# Patient Record
Sex: Male | Born: 1960 | Race: White | Hispanic: Yes | Marital: Married | Smoking: Never smoker
Health system: Southern US, Community
[De-identification: ages and names within clinical notes are randomized; demographics above are authoritative.]

## PROBLEM LIST (undated history)

## (undated) DIAGNOSIS — E119 Type 2 diabetes mellitus without complications: Secondary | ICD-10-CM

---

## 2007-01-21 ENCOUNTER — Emergency Department: Payer: Self-pay | Admitting: Emergency Medicine

## 2015-02-25 ENCOUNTER — Encounter: Payer: Self-pay | Admitting: *Deleted

## 2015-02-25 ENCOUNTER — Emergency Department
Admission: EM | Admit: 2015-02-25 | Discharge: 2015-02-25 | Disposition: A | Discharge status: Home / Self Care | Payer: No Typology Code available for payment source | Attending: Emergency Medicine | Admitting: Emergency Medicine

## 2015-02-25 ENCOUNTER — Emergency Department: Payer: No Typology Code available for payment source

## 2015-02-25 DIAGNOSIS — S93401A Sprain of unspecified ligament of right ankle, initial encounter: Secondary | ICD-10-CM | POA: Diagnosis not present

## 2015-02-25 DIAGNOSIS — S46911A Strain of unspecified muscle, fascia and tendon at shoulder and upper arm level, right arm, initial encounter: Secondary | ICD-10-CM

## 2015-02-25 DIAGNOSIS — S161XXA Strain of muscle, fascia and tendon at neck level, initial encounter: Secondary | ICD-10-CM | POA: Diagnosis not present

## 2015-02-25 DIAGNOSIS — S39012A Strain of muscle, fascia and tendon of lower back, initial encounter: Secondary | ICD-10-CM | POA: Diagnosis not present

## 2015-02-25 DIAGNOSIS — Y9389 Activity, other specified: Secondary | ICD-10-CM | POA: Insufficient documentation

## 2015-02-25 DIAGNOSIS — E119 Type 2 diabetes mellitus without complications: Secondary | ICD-10-CM | POA: Insufficient documentation

## 2015-02-25 DIAGNOSIS — R52 Pain, unspecified: Secondary | ICD-10-CM

## 2015-02-25 DIAGNOSIS — Y998 Other external cause status: Secondary | ICD-10-CM | POA: Diagnosis not present

## 2015-02-25 DIAGNOSIS — S50811A Abrasion of right forearm, initial encounter: Secondary | ICD-10-CM | POA: Insufficient documentation

## 2015-02-25 DIAGNOSIS — Z79899 Other long term (current) drug therapy: Secondary | ICD-10-CM | POA: Diagnosis not present

## 2015-02-25 DIAGNOSIS — Y92410 Unspecified street and highway as the place of occurrence of the external cause: Secondary | ICD-10-CM | POA: Diagnosis not present

## 2015-02-25 DIAGNOSIS — S199XXA Unspecified injury of neck, initial encounter: Secondary | ICD-10-CM | POA: Diagnosis present

## 2015-02-25 HISTORY — DX: Type 2 diabetes mellitus without complications: E11.9

## 2015-02-25 MED ORDER — CYCLOBENZAPRINE HCL 10 MG PO TABS: 10.0000 mg | ORAL_TABLET | Freq: Three times a day (TID) | ORAL | Status: AC | PRN

## 2015-02-25 MED ORDER — OXYCODONE-ACETAMINOPHEN 5-325 MG PO TABS: 1.0000 | ORAL_TABLET | Freq: Four times a day (QID) | ORAL | Status: AC | PRN

## 2015-02-25 MED ORDER — OXYCODONE HCL 5 MG PO TABS
5.0000 mg | ORAL_TABLET | Freq: Once | ORAL | Status: AC
  Administered 2015-02-25: 5 mg via ORAL

## 2015-02-25 MED ORDER — NAPROXEN 500 MG PO TABS: 500.0000 mg | ORAL_TABLET | Freq: Two times a day (BID) | ORAL | Status: AC

## 2015-02-25 MED ORDER — OXYCODONE HCL 5 MG PO TABS
ORAL_TABLET | ORAL | Status: AC
  Administered 2015-02-25: 5 mg via ORAL
  Filled 2015-02-25: qty 1
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
## 2015-02-25 NOTE — ED Notes (Signed)
Pt at Xray at this time 

## 2015-04-13 ENCOUNTER — Other Ambulatory Visit: Payer: Self-pay | Admitting: Orthopedic Surgery

## 2015-04-13 DIAGNOSIS — S39012D Strain of muscle, fascia and tendon of lower back, subsequent encounter: Secondary | ICD-10-CM

## 2015-04-13 DIAGNOSIS — M5030 Other cervical disc degeneration, unspecified cervical region: Secondary | ICD-10-CM

## 2015-04-13 DIAGNOSIS — M5412 Radiculopathy, cervical region: Secondary | ICD-10-CM

## 2015-04-13 DIAGNOSIS — S161XXD Strain of muscle, fascia and tendon at neck level, subsequent encounter: Secondary | ICD-10-CM

## 2015-04-13 DIAGNOSIS — M5416 Radiculopathy, lumbar region: Secondary | ICD-10-CM

## 2015-04-20 ENCOUNTER — Ambulatory Visit
Admission: RE | Admit: 2015-04-20 | Discharge: 2015-04-20 | Disposition: A | Discharge status: Home / Self Care | Payer: No Typology Code available for payment source | Source: Ambulatory Visit | Attending: Orthopedic Surgery | Admitting: Orthopedic Surgery

## 2015-04-20 DIAGNOSIS — S39012D Strain of muscle, fascia and tendon of lower back, subsequent encounter: Secondary | ICD-10-CM

## 2015-04-20 DIAGNOSIS — M4802 Spinal stenosis, cervical region: Secondary | ICD-10-CM | POA: Insufficient documentation

## 2015-04-20 DIAGNOSIS — M5030 Other cervical disc degeneration, unspecified cervical region: Secondary | ICD-10-CM | POA: Insufficient documentation

## 2015-04-20 DIAGNOSIS — M5416 Radiculopathy, lumbar region: Secondary | ICD-10-CM

## 2015-04-20 DIAGNOSIS — S161XXD Strain of muscle, fascia and tendon at neck level, subsequent encounter: Secondary | ICD-10-CM

## 2015-04-20 DIAGNOSIS — M5412 Radiculopathy, cervical region: Secondary | ICD-10-CM
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
## 2016-11-04 ENCOUNTER — Emergency Department
Admission: EM | Admit: 2016-11-04 | Discharge: 2016-11-04 | Disposition: A | Discharge status: Home / Self Care | Payer: Self-pay | Attending: Emergency Medicine | Admitting: Emergency Medicine

## 2016-11-04 DIAGNOSIS — Z79899 Other long term (current) drug therapy: Secondary | ICD-10-CM | POA: Insufficient documentation

## 2016-11-04 DIAGNOSIS — J111 Influenza due to unidentified influenza virus with other respiratory manifestations: Secondary | ICD-10-CM | POA: Insufficient documentation

## 2016-11-04 DIAGNOSIS — E119 Type 2 diabetes mellitus without complications: Secondary | ICD-10-CM | POA: Insufficient documentation

## 2016-11-04 MED ORDER — DEXAMETHASONE SODIUM PHOSPHATE 10 MG/ML IJ SOLN
INTRAMUSCULAR | Status: AC
  Administered 2016-11-04: 10 mg via INTRAMUSCULAR
  Filled 2016-11-04: qty 1

## 2016-11-04 MED ORDER — OSELTAMIVIR PHOSPHATE 75 MG PO CAPS: 75.0000 mg | ORAL_CAPSULE | Freq: Two times a day (BID) | ORAL | 0 refills | Status: AC

## 2016-11-04 MED ORDER — DEXAMETHASONE SODIUM PHOSPHATE 10 MG/ML IJ SOLN
10.0000 mg | Freq: Once | INTRAMUSCULAR | Status: AC
  Administered 2016-11-04: 10 mg via INTRAMUSCULAR

## 2016-11-04 NOTE — ED Provider Notes (Signed)
[facility] Emergency Department Provider Note  ____________________________________________  Time seen: Approximately 8:54 PM  I have reviewed the triage vital signs and the nursing notes.   HISTORY  Chief Complaint Influenza and Cough    HPI James HarrisJose Ramon Vilma James Floyd James Floyd is a 56 y.o. male presenting to the emergency department with headache, pharyngitis and generalized body aches that started 2 days ago. Triage note noted. Patient states that he has had fever. However, he has not evaluated his temperature. Patient has been staying hydrated. Patient still has a good appetite. No recent travel. Patient has several family members who have experienced similar symptoms. Patient has tried TheraFlu but no other alleviating measures. Patient denies chest pain, shortness of breath, chest tightness, abdominal pain and vomiting.    Past Medical History:  Diagnosis Date  . Diabetes mellitus without complication (HCC)     There are no active problems to display for this patient.   History reviewed. No pertinent surgical history.  Prior to Admission medications   Medication Sig Start Date End Date Taking? Authorizing Provider  cyclobenzaprine (FLEXERIL) 10 MG tablet Take 1 tablet (10 mg total) by mouth 3 (three) times daily as needed for muscle spasms. 02/25/15   Chinita Pesterari B Triplett, FNP  oseltamivir (TAMIFLU) 75 MG capsule Take 1 capsule (75 mg total) by mouth 2 (two) times daily. 11/04/16 11/09/16  Orvil FeilJaclyn M Josemanuel Eakins, PA-C  oxyCODONE-acetaminophen (ROXICET) 5-325 MG per tablet Take 1 tablet by mouth every 6 (six) hours as needed. 02/25/15   Chinita Pesterari B Triplett, FNP    Allergies Patient has no known allergies.  No family history on file.  Social History Social History  Substance Use Topics  . Smoking status: Never Smoker  . Smokeless tobacco: Never Used  . Alcohol use No     Review of Systems  Constitutional: Patient has had fever.  Eyes: No visual changes. No discharge ENT:  Patient has had congestion.  Cardiovascular: no chest pain. Respiratory: Patient has had non-productive cough.  No SOB. Gastrointestinal: Patient has had nausea.  Genitourinary: Negative for dysuria. No hematuria Musculoskeletal: Patient has had myalgias. Skin: Negative for rash, abrasions, lacerations, ecchymosis. Neurological: Patient has headache, no focal weakness or numbness.   ____________________________________________   PHYSICAL EXAM:  VITAL SIGNS: ED Triage Vitals  Enc Vitals Group     BP 11/04/16 1728 (!) 133/95     Pulse Rate 11/04/16 1728 (!) 107     Resp 11/04/16 1728 16     Temp 11/04/16 1728 98.9 F (37.2 C)     Temp Source 11/04/16 1728 Oral     SpO2 11/04/16 1728 98 %     Weight 11/04/16 1729 260 lb (117.9 kg)     Height 11/04/16 1729 5\' 8"  (1.727 m)     Head Circumference --      Peak Flow --      Pain Score 11/04/16 1729 9     Pain Loc --      Pain Edu? --      Excl. in GC? --      Constitutional: Alert and oriented. Patient is lying supine in bed.  Eyes: Conjunctivae are normal. PERRL. EOMI. Head: Atraumatic. ENT:      Ears: Tympanic membranes are injected bilaterally without evidence of effusion or purulent exudate. Bony landmarks are visualized bilaterally. No pain with palpation at the tragus.      Nose: Nasal turbinates are edematous and erythematous. Copious rhinorrhea visualized.      Mouth/Throat: Mucous membranes  are moist. Posterior pharynx is mildly erythematous. Patient has mild tonsillar hypertrophy without purulent exudate. Tonsils assessed at 3. Uvula is midline. Neck: Full range of motion. No pain is elicited with flexion at the neck. Hematological/Lymphatic/Immunilogical: No cervical lymphadenopathy. Cardiovascular: Normal rate, regular rhythm. Normal S1 and S2.  Good peripheral circulation. Respiratory: Normal respiratory effort without tachypnea or retractions. Lungs CTAB. Good air entry to the bases with no decreased or absent  breath sounds. Gastrointestinal: Bowel sounds 4 quadrants. Soft and nontender to palpation. No guarding or rigidity. No palpable masses. No distention. No CVA tenderness.  Skin:  Skin is warm, dry and intact. No rash noted. Psychiatric: Mood and affect are normal. Speech and behavior are normal. Patient exhibits appropriate insight and judgement.  ____________________________________________   LABS (all labs ordered are listed, but only abnormal results are displayed)  Labs Reviewed - No data to display ____________________________________________  EKG   ____________________________________________  RADIOLOGY  No results found.  ____________________________________________    PROCEDURES  Procedure(s) performed:    Procedures    Medications  dexamethasone (DECADRON) injection 10 mg (10 mg Intramuscular Given 11/04/16 1813)     ____________________________________________   INITIAL IMPRESSION / ASSESSMENT AND PLAN / ED COURSE  Pertinent labs & imaging results that were available during my care of the patient were reviewed by me and considered in my medical decision making (see chart for details).  Review of the [place] CSRS was performed in accordance of the [facility] prior to dispensing any controlled drugs.    Assessment and plan: Influenza:  Patient presents to the emergency department with headache, pharyngitis and generalized body aches that started 2 days ago. Patient's symptoms are consistent with influenza. Patient was given Decadron in the emergency department for pharyngitis. He was discharged with Tamiflu. Rest and hydration were encouraged. Patient was advised to follow-up with his primary care provider in one week. Heart rate was reassessed at 95 beats per minute prior to discharge by myself. Physical exam and vital signs are reassuring at this time. All patient questions were answered. ____________________________________________  FINAL CLINICAL IMPRESSION(S)  / ED DIAGNOSES  Final diagnoses:  Influenza      NEW MEDICATIONS STARTED DURING THIS VISIT:  Discharge Medication List as of 11/04/2016  6:11 PM    START taking these medications   Details  oseltamivir (TAMIFLU) 75 MG capsule Take 1 capsule (75 mg total) by mouth 2 (two) times daily., Starting Sun 11/04/2016, Until Fri 11/09/2016, Print            This chart was dictated using voice recognition software/Dragon. Despite best efforts to proofread, errors can occur which can change the meaning. Any change was purely unintentional.    Orvil Feil, PA-C 11/04/16 2102    Jene Every, MD 11/05/16 [phone]

## 2016-11-04 NOTE — ED Notes (Signed)
AAOx3.  Skin warm and dry.  NAD 

## 2016-11-04 NOTE — ED Triage Notes (Signed)
Pt reports that he has generalized body aches with sore throat - pt has been sick for 2 weeks off an on - cough and nasal congestion

## 2017-10-14 ENCOUNTER — Other Ambulatory Visit: Payer: Self-pay | Admitting: Obstetrics and Gynecology

## 2017-10-14 ENCOUNTER — Ambulatory Visit
Admission: RE | Admit: 2017-10-14 | Discharge: 2017-10-14 | Disposition: A | Discharge status: Home / Self Care | Payer: Disability Insurance | Source: Ambulatory Visit | Attending: Obstetrics and Gynecology | Admitting: Obstetrics and Gynecology

## 2017-10-14 DIAGNOSIS — M47819 Spondylosis without myelopathy or radiculopathy, site unspecified: Secondary | ICD-10-CM

## 2017-10-14 DIAGNOSIS — S99919S Unspecified injury of unspecified ankle, sequela: Secondary | ICD-10-CM | POA: Diagnosis not present

## 2017-10-14 DIAGNOSIS — M19079 Primary osteoarthritis, unspecified ankle and foot: Secondary | ICD-10-CM

## 2017-10-14 DIAGNOSIS — S99911A Unspecified injury of right ankle, initial encounter: Secondary | ICD-10-CM

## 2017-10-14 DIAGNOSIS — S3992XA Unspecified injury of lower back, initial encounter: Secondary | ICD-10-CM

## 2017-10-14 DIAGNOSIS — M47896 Other spondylosis, lumbar region: Secondary | ICD-10-CM | POA: Diagnosis not present

## 2017-10-14 DIAGNOSIS — M47899 Other spondylosis, site unspecified: Secondary | ICD-10-CM | POA: Insufficient documentation

## 2017-10-14 DIAGNOSIS — R52 Pain, unspecified: Secondary | ICD-10-CM | POA: Diagnosis not present

## 2017-10-14 DIAGNOSIS — X58XXXS Exposure to other specified factors, sequela: Secondary | ICD-10-CM | POA: Insufficient documentation

## 2017-10-14 DIAGNOSIS — I10 Essential (primary) hypertension: Secondary | ICD-10-CM | POA: Diagnosis not present

## 2017-10-14 DIAGNOSIS — S199XXS Unspecified injury of neck, sequela: Secondary | ICD-10-CM | POA: Diagnosis present

## 2017-10-14 DIAGNOSIS — E119 Type 2 diabetes mellitus without complications: Secondary | ICD-10-CM | POA: Diagnosis not present

## 2019-12-26 ENCOUNTER — Other Ambulatory Visit: Payer: Self-pay

## 2019-12-26 ENCOUNTER — Ambulatory Visit: Payer: Disability Insurance | Attending: Internal Medicine

## 2019-12-26 DIAGNOSIS — Z23 Encounter for immunization: Secondary | ICD-10-CM

## 2019-12-26 NOTE — Progress Notes (Signed)
   Covid-19 Vaccination Clinic  Name:  James Floyd    MRN: [id number] DOB: 12-17-1960  12/26/2019  Mr. James Floyd was observed post Covid-19 immunization for 15 minutes without incident. He was provided with Vaccine Information Sheet and instruction to access the V-Safe system.   Mr. James Floyd was instructed to call 911 with any severe reactions post vaccine: Marland Kitchen Difficulty breathing  . Swelling of face and throat  . A fast heartbeat  . A bad rash all over body  . Dizziness and weakness   Immunizations Administered    Name Date Dose VIS Date Route   Pfizer COVID-19 Vaccine 12/26/2019  1:28 PM 0.3 mL 09/18/2019 Intramuscular   Manufacturer: [employer], [employer]   Lot: [id number]   NDC: [address]-3

## 2020-01-20 ENCOUNTER — Ambulatory Visit: Payer: Disability Insurance | Attending: Internal Medicine

## 2020-01-20 DIAGNOSIS — Z23 Encounter for immunization: Secondary | ICD-10-CM

## 2020-01-20 NOTE — Progress Notes (Signed)
   Covid-19 Vaccination Clinic  Name:  Stepfon Rawles    MRN: [id number] DOB: Oct 16, 1960  01/20/2020  Mr. Lajuane Leatham was observed post Covid-19 immunization for 15 minutes without incident. He was provided with Vaccine Information Sheet and instruction to access the V-Safe system.   Mr. Jshawn Hurta was instructed to call 911 with any severe reactions post vaccine: Marland Kitchen Difficulty breathing  . Swelling of face and throat  . A fast heartbeat  . A bad rash all over body  . Dizziness and weakness   Immunizations Administered    Name Date Dose VIS Date Route   Pfizer COVID-19 Vaccine 01/20/2020  3:26 PM 0.3 mL 09/18/2019 Intramuscular   Manufacturer: [employer], [employer]   Lot: [id number]   NDC: [address]-2

## 2020-12-13 ENCOUNTER — Ambulatory Visit: Payer: Medicare Other | Attending: Neurology

## 2020-12-13 DIAGNOSIS — G4733 Obstructive sleep apnea (adult) (pediatric): Secondary | ICD-10-CM | POA: Insufficient documentation

## 2020-12-14 ENCOUNTER — Other Ambulatory Visit: Payer: Self-pay

## 2023-09-30 DIAGNOSIS — Z0130 Encounter for examination of blood pressure without abnormal findings: Secondary | ICD-10-CM | POA: Diagnosis not present

## 2023-09-30 DIAGNOSIS — Z1331 Encounter for screening for depression: Secondary | ICD-10-CM | POA: Diagnosis not present

## 2023-09-30 DIAGNOSIS — E119 Type 2 diabetes mellitus without complications: Secondary | ICD-10-CM | POA: Diagnosis not present

## 2023-09-30 DIAGNOSIS — M25511 Pain in right shoulder: Secondary | ICD-10-CM | POA: Diagnosis not present

## 2023-09-30 DIAGNOSIS — I10 Essential (primary) hypertension: Secondary | ICD-10-CM | POA: Diagnosis not present

## 2023-09-30 DIAGNOSIS — Z1389 Encounter for screening for other disorder: Secondary | ICD-10-CM | POA: Diagnosis not present

## 2023-09-30 DIAGNOSIS — Z0131 Encounter for examination of blood pressure with abnormal findings: Secondary | ICD-10-CM | POA: Diagnosis not present

## 2023-10-07 DIAGNOSIS — Z1389 Encounter for screening for other disorder: Secondary | ICD-10-CM | POA: Diagnosis not present

## 2023-10-07 DIAGNOSIS — Z0000 Encounter for general adult medical examination without abnormal findings: Secondary | ICD-10-CM | POA: Diagnosis not present

## 2023-10-07 DIAGNOSIS — Z0131 Encounter for examination of blood pressure with abnormal findings: Secondary | ICD-10-CM | POA: Diagnosis not present

## 2023-10-07 DIAGNOSIS — Z23 Encounter for immunization: Secondary | ICD-10-CM | POA: Diagnosis not present

## 2023-10-17 DIAGNOSIS — Z0131 Encounter for examination of blood pressure with abnormal findings: Secondary | ICD-10-CM | POA: Diagnosis not present

## 2023-10-17 DIAGNOSIS — Z1389 Encounter for screening for other disorder: Secondary | ICD-10-CM | POA: Diagnosis not present

## 2023-10-17 DIAGNOSIS — Z712 Person consulting for explanation of examination or test findings: Secondary | ICD-10-CM | POA: Diagnosis not present

## 2023-10-17 DIAGNOSIS — H539 Unspecified visual disturbance: Secondary | ICD-10-CM | POA: Diagnosis not present

## 2023-10-28 DIAGNOSIS — Z0100 Encounter for examination of eyes and vision without abnormal findings: Secondary | ICD-10-CM | POA: Diagnosis not present

## 2023-10-28 DIAGNOSIS — M9903 Segmental and somatic dysfunction of lumbar region: Secondary | ICD-10-CM | POA: Diagnosis not present

## 2023-10-28 DIAGNOSIS — H2513 Age-related nuclear cataract, bilateral: Secondary | ICD-10-CM | POA: Diagnosis not present

## 2023-10-28 DIAGNOSIS — M542 Cervicalgia: Secondary | ICD-10-CM | POA: Diagnosis not present

## 2023-10-28 DIAGNOSIS — M9901 Segmental and somatic dysfunction of cervical region: Secondary | ICD-10-CM | POA: Diagnosis not present

## 2023-10-28 DIAGNOSIS — R519 Headache, unspecified: Secondary | ICD-10-CM | POA: Diagnosis not present

## 2023-10-29 DIAGNOSIS — R519 Headache, unspecified: Secondary | ICD-10-CM | POA: Diagnosis not present

## 2023-10-29 DIAGNOSIS — M9901 Segmental and somatic dysfunction of cervical region: Secondary | ICD-10-CM | POA: Diagnosis not present

## 2023-10-29 DIAGNOSIS — M9903 Segmental and somatic dysfunction of lumbar region: Secondary | ICD-10-CM | POA: Diagnosis not present

## 2023-10-29 DIAGNOSIS — M542 Cervicalgia: Secondary | ICD-10-CM | POA: Diagnosis not present

## 2023-10-30 DIAGNOSIS — R519 Headache, unspecified: Secondary | ICD-10-CM | POA: Diagnosis not present

## 2023-10-30 DIAGNOSIS — M9901 Segmental and somatic dysfunction of cervical region: Secondary | ICD-10-CM | POA: Diagnosis not present

## 2023-10-30 DIAGNOSIS — M9903 Segmental and somatic dysfunction of lumbar region: Secondary | ICD-10-CM | POA: Diagnosis not present

## 2023-10-30 DIAGNOSIS — M542 Cervicalgia: Secondary | ICD-10-CM | POA: Diagnosis not present

## 2023-11-02 DIAGNOSIS — I10 Essential (primary) hypertension: Secondary | ICD-10-CM | POA: Diagnosis not present

## 2023-11-02 DIAGNOSIS — R0789 Other chest pain: Secondary | ICD-10-CM | POA: Diagnosis not present

## 2023-11-02 DIAGNOSIS — E119 Type 2 diabetes mellitus without complications: Secondary | ICD-10-CM | POA: Diagnosis not present
